# Patient Record
Sex: Female | Born: 1980 | Race: White | Hispanic: No | Marital: Married | Smoking: Never smoker
Health system: Southern US, Community
[De-identification: ages and names within clinical notes are randomized; demographics above are authoritative.]

## PROBLEM LIST (undated history)

## (undated) HISTORY — PX: TUBAL LIGATION: SHX77

---

## 2015-04-20 ENCOUNTER — Ambulatory Visit: Payer: Self-pay | Admitting: Family Medicine

## 2015-04-26 ENCOUNTER — Ambulatory Visit: Payer: Self-pay | Admitting: Family Medicine

## 2015-06-08 ENCOUNTER — Ambulatory Visit (INDEPENDENT_AMBULATORY_CARE_PROVIDER_SITE_OTHER): Payer: BLUE CROSS/BLUE SHIELD

## 2015-06-08 ENCOUNTER — Encounter: Payer: Self-pay | Admitting: Family Medicine

## 2015-06-08 ENCOUNTER — Ambulatory Visit (INDEPENDENT_AMBULATORY_CARE_PROVIDER_SITE_OTHER): Payer: BLUE CROSS/BLUE SHIELD | Admitting: Family Medicine

## 2015-06-08 VITALS — BP 126/69 | HR 104 | Temp 98.3°F | Ht 65.0 in | Wt 204.0 lb

## 2015-06-08 DIAGNOSIS — Z9851 Tubal ligation status: Secondary | ICD-10-CM | POA: Diagnosis not present

## 2015-06-08 DIAGNOSIS — R05 Cough: Secondary | ICD-10-CM

## 2015-06-08 DIAGNOSIS — Z111 Encounter for screening for respiratory tuberculosis: Secondary | ICD-10-CM

## 2015-06-08 DIAGNOSIS — R059 Cough, unspecified: Secondary | ICD-10-CM | POA: Insufficient documentation

## 2015-06-08 MED ORDER — OMEPRAZOLE 40 MG PO CPDR: 40.0000 mg | DELAYED_RELEASE_CAPSULE | Freq: Every day | ORAL | Status: DC

## 2015-06-08 MED ORDER — GUAIFENESIN-CODEINE 100-10 MG/5ML PO SOLN: 5.0000 mL | Freq: Every evening | ORAL | Status: DC | PRN

## 2015-06-08 NOTE — Progress Notes (Signed)
Megan Coffey is a 34 y.o. female who [facility]  today for cough. Patient has a four-week history of bothersome cough. The cough is nonproductive. She denies any wheezing fevers chills nausea vomiting or diarrhea. She does note some mild her burning symptoms. She notes some sneezing and nasal congestion and a postnasal drip feeling. She has tried a Z-Pak and LawyerTessalon Perles which have helped only a little. She notes her symptoms started with a runny nose and sore throat. The cough is persistent. She feels well otherwise. She's tried some of her husband's codeine cough syrup which helps a lot.  Mother has history of latent tuberculosis. Patient has never been tested.   History reviewed. No pertinent past medical history. Past Surgical History  Procedure Laterality Date  . Cesarean section    . Tubal ligation     Social History  Substance Use Topics  . Smoking status: Never Smoker   . Smokeless tobacco: Not on file  . Alcohol Use: 0.0 oz/week    0 Standard drinks or equivalent per week   family history includes Diabetes in her maternal grandfather.  ROS as above Medications: Current Outpatient Prescriptions  Medication Sig Dispense Refill  . benzonatate (TESSALON) 200 MG capsule TAKE 1 CAPSULE 3 TIMES DAILY AS NEEDED FOR COUGH  1  . guaiFENesin-codeine 100-10 MG/5ML syrup Take 5 mLs by mouth at bedtime as needed for cough. 236 mL 0  . omeprazole (PRILOSEC) 40 MG capsule Take 1 capsule (40 mg total) by mouth daily. 30 capsule 3   No current facility-administered medications for this visit.   Allergies  Allergen Reactions  . Hydrocodone-Chlorpheniramine Hives, Itching and Nausea And Vomiting     Exam:  BP 126/69 mmHg  Pulse 104  Temp(Src) 98.3 F (36.8 C) (Oral)  Ht 5\' 5"  (1.651 m)  Wt 204 lb (92.534 kg)  BMI 33.95 kg/m2  SpO2 100% Gen: Well NAD HEENT: EOMI,  MMM clear nasal discharge. Normal tympanic membranes. Posterior  pharynx cobblestoning. No significant cervical lymphadenopathy. Lungs: Normal work of breathing. CTABL Heart: RRR no MRG Abd: NABS, Soft. Nondistended, Nontender Exts: Brisk capillary refill, warm and well perfused.   No results found for this or any previous visit (from the past 24 hour(s)). Dg Chest 2 View  06/08/2015  CLINICAL DATA:  Cough for 2 weeks EXAM: CHEST  2 VIEW COMPARISON:  None. FINDINGS: Cardiomediastinal silhouette is unremarkable. No acute infiltrate or pleural effusion. No pulmonary edema. Bony thorax is unremarkable. IMPRESSION: No active cardiopulmonary disease. Electronically Signed   By: Natasha MeadLiviu  Pop M.D.   On: 06/08/2015 15:35     Please see individual assessment and plan sections.

## 2015-06-08 NOTE — Assessment & Plan Note (Signed)
Subacute cough. Treat with omeprazole codeine cough syrup nasal steroid spray and Zyrtec. Check PPD his mother has history of latent tuberculosis. Return in 2-4 weeks

## 2015-06-08 NOTE — Patient Instructions (Signed)
Thank you for coming in today. Get xray today.  Surgeon taking omeprazole daily. Also take over-the-counter Zyrtec daily and use Flonase, Rhinocort, or Nasonex. Use codeine cough syrup at bedtime as needed. Do not drive after taking this medicine. Return On Friday to read the PPD test Return in 2-4 weeks if no better. Call or go to the emergency room if you get worse, have trouble breathing, have chest pains, or palpitations.    Cough, Adult Coughing is a reflex that clears your throat and your airways. Coughing helps to heal and protect your lungs. It is normal to cough occasionally, but a cough that happens with other symptoms or lasts a long time may be a sign of a condition that needs treatment. A cough may last only 2-3 weeks (acute), or it may last longer than 8 weeks (chronic). CAUSES Coughing is commonly caused by:  Breathing in substances that irritate your lungs.  A viral or bacterial respiratory infection.  Allergies.  Asthma.  Postnasal drip.  Smoking.  Acid backing up from the stomach into the esophagus (gastroesophageal reflux).  Certain medicines.  Chronic lung problems, including COPD (or rarely, lung cancer).  Other medical conditions such as heart failure. HOME CARE INSTRUCTIONS  Pay attention to any changes in your symptoms. Take these actions to help with your discomfort:  Take medicines only as told by your health care provider.  If you were prescribed an antibiotic medicine, take it as told by your health care provider. Do not stop taking the antibiotic even if you start to feel better.  Talk with your health care provider before you take a cough suppressant medicine.  Drink enough fluid to keep your urine clear or pale yellow.  If the air is dry, use a cold steam vaporizer or humidifier in your bedroom or your home to help loosen secretions.  Avoid anything that causes you to cough at work or at home.  If your cough is worse at night, try  sleeping in a semi-upright position.  Avoid cigarette smoke. If you smoke, quit smoking. If you need help quitting, ask your health care provider.  Avoid caffeine.  Avoid alcohol.  Rest as needed. SEEK MEDICAL CARE IF:   You have new symptoms.  You cough up pus.  Your cough does not get better after 2-3 weeks, or your cough gets worse.  You cannot control your cough with suppressant medicines and you are losing sleep.  You develop pain that is getting worse or pain that is not controlled with pain medicines.  You have a fever.  You have unexplained weight loss.  You have night sweats. SEEK IMMEDIATE MEDICAL CARE IF:  You cough up blood.  You have difficulty breathing.  Your heartbeat is very fast.   This information is not intended to replace advice given to you by your health care provider. Make sure you discuss any questions you have with your health care provider.   Document Released: 02/09/2011 Document Revised: 05/04/2015 Document Reviewed: 10/20/2014 Elsevier Interactive Patient Education [employer] Inc[employer]

## 2015-06-08 NOTE — Progress Notes (Signed)
Quick Note:  Xray normal. ______ 

## 2015-06-10 ENCOUNTER — Encounter: Payer: Self-pay | Admitting: *Deleted

## 2015-12-14 ENCOUNTER — Ambulatory Visit (INDEPENDENT_AMBULATORY_CARE_PROVIDER_SITE_OTHER): Payer: BLUE CROSS/BLUE SHIELD | Admitting: Family Medicine

## 2015-12-14 ENCOUNTER — Encounter: Payer: Self-pay | Admitting: Family Medicine

## 2015-12-14 ENCOUNTER — Ambulatory Visit (HOSPITAL_BASED_OUTPATIENT_CLINIC_OR_DEPARTMENT_OTHER)
Admission: RE | Admit: 2015-12-14 | Discharge: 2015-12-14 | Disposition: A | Discharge status: Home / Self Care | Payer: BLUE CROSS/BLUE SHIELD | Source: Ambulatory Visit | Attending: Family Medicine | Admitting: Family Medicine

## 2015-12-14 VITALS — BP 125/81 | HR 97 | Wt 198.0 lb

## 2015-12-14 DIAGNOSIS — R1011 Right upper quadrant pain: Secondary | ICD-10-CM | POA: Insufficient documentation

## 2015-12-14 NOTE — Patient Instructions (Signed)
Thank you for coming in today. We can get you in [facility] at [facility].  Get labs now then get Ultrasound at 330.  We will call tonight or tomorrow with results.  Eat a clear liquid diet.   If your belly pain worsens, or you have high fever, bad vomiting, blood in your stool or black tarry stool go to the Emergency Room.   Cholecystitis Cholecystitis is inflammation of the gallbladder. It is often called a gallbladder attack. The gallbladder is a pear-shaped organ that lies beneath the liver on the right side of the body. The gallbladder stores bile, which is a fluid that helps the body to digest fats. If bile builds up in your gallbladder, your gallbladder becomes inflamed. This condition may occur suddenly (be acute). Repeat episodes of acute cholecystitis or prolonged episodes may lead to a long-term (chronic) condition. Cholecystitis is serious and it requires treatment.  CAUSES The most common cause of this condition is gallstones. Gallstones can block the tube (duct) that carries bile out of your gallbladder. This causes bile to build up. Other causes of this condition include:  Damage to the gallbladder due to a decrease in blood flow.  Infections in the bile ducts.  Scars or kinks in the bile ducts.  Tumors in the liver, pancreas, or gallbladder. RISK FACTORS This condition is more likely to develop in:  People who have sickle cell disease.  People who take birth control pills or use estrogen.  People who have alcoholic liver disease.  People who have liver cirrhosis.  People who have their nutrition delivered through a vein (parenteral nutrition).  People who do not eat or drink (do fasting) for a long period of time.  People who are obese.  People who have rapid weight loss.  People who are pregnant.  People who have increased triglyceride levels.  People who have pancreatitis. SYMPTOMS Symptoms of this condition include:  Abdominal pain, especially in  the upper right area of the abdomen.  Abdominal tenderness or bloating.  Nausea.  Vomiting.  Fever.  Chills.  Yellowing of the skin and the whites of the eyes (jaundice). DIAGNOSIS This condition is diagnosed with a medical history and physical exam. You may also have other tests, including:  Imaging tests, such as:  An ultrasound of the gallbladder.  A CT scan of the abdomen.  A gallbladder nuclear scan (HIDA scan). This scan allows your health care provider to see the bile moving from your liver to your gallbladder and to your small intestine.  MRI.  Blood tests, such as:  A complete blood count, because the white blood cell count may be higher than normal.  Liver function tests, because some levels may be higher than normal with certain types of gallstones. TREATMENT Treatment may include:  Fasting for a certain amount of time.  IV fluids.  Medicine to treat pain or vomiting.  Antibiotic medicine.  Surgery to remove your gallbladder (cholecystectomy). This may happen immediately or at a later time. HOME CARE INSTRUCTIONS Home care will depend on your treatment. In general:  Take over-the-counter and prescription medicines only as told by your health care provider.  If you were prescribed an antibiotic medicine, take it as told by your health care provider. Do not stop taking the antibiotic even if you start to feel better.  Follow instructions from your health care provider about what to eat or drink. When you are allowed to eat, avoid eating or drinking anything that triggers your symptoms.  Keep all follow-up visits as told by your health care provider. This is important. SEEK MEDICAL CARE IF:  Your pain is not controlled with medicine.  You have a fever. SEEK IMMEDIATE MEDICAL CARE IF:  Your pain moves to another part of your abdomen or to your back.  You continue to have symptoms or you develop new symptoms even with treatment.   This information  is not intended to replace advice given to you by your health care provider. Make sure you discuss any questions you have with your health care provider.   Document Released: 08/13/2005 Document Revised: 05/04/2015 Document Reviewed: 11/24/2014 Elsevier Interactive Patient Education [employer]

## 2015-12-14 NOTE — Progress Notes (Signed)
       Megan Coffey is a 35 y.o. female who presents to [facility]: [facility] today for abdominal pain. Patient has a one-day history of right upper quadrant abdominal pain. She denies objective fevers but notes chills and body aches. She denies any nausea vomiting or diarrhea but does note decreased appetite. She notes the pain radiates to the back and is intermittent. She was seen by her work physician is concern for gallbladder disease. She denies any history of abdominal surgery aside from C-section and tubal ligation.   No past medical history on file. Past Surgical History  Procedure Laterality Date  . Cesarean section    . Tubal ligation     Social History  Substance Use Topics  . Smoking status: Never Smoker   . Smokeless tobacco: Not on file  . Alcohol Use: 0.0 oz/week    0 Standard drinks or equivalent per week   family history includes Diabetes in her maternal grandfather.  ROS as above Medications: No current outpatient prescriptions on file.   No current facility-administered medications for this visit.   Allergies  Allergen Reactions  . Hydrocodone-Chlorpheniramine Hives, Itching and Nausea And Vomiting     Exam:  BP 125/81 mmHg  Pulse 97  Wt 198 lb (89.812 kg) Gen: Well NAD Nontoxic appearing HEENT: EOMI,  MMM Lungs: Normal work of breathing. CTABL Heart: RRR no MRG Abd: NABS, Soft. Nondistended, tender palpation right upper quadrant with positive Murphy sign. No masses palpated. No rebound or guarding. Exts: Brisk capillary refill, warm and well perfused.   No results found for this or any previous visit (from the past 24 hour(s)). No results found.   [age] year old woman with right upper quadrant abdominal pain. Positive Murphy sign. Concerning for Colecystitis. Plan for CBC CMP lipase and stat abdominal ultrasound. Patient with results tonight or tomorrow.

## 2015-12-15 LAB — CBC
HCT: 39.7 % (ref 35.0–45.0)
Hemoglobin: 12.9 g/dL (ref 11.7–15.5)
MCH: 29.8 pg (ref 27.0–33.0)
MCHC: 32.5 g/dL (ref 32.0–36.0)
MCV: 91.7 fL (ref 80.0–100.0)
MPV: 10.6 fL (ref 7.5–12.5)
PLATELETS: 339 10*3/uL (ref 140–400)
RBC: 4.33 MIL/uL (ref 3.80–5.10)
RDW: 13.1 % (ref 11.0–15.0)
WBC: 10.1 10*3/uL (ref 3.8–10.8)

## 2015-12-15 LAB — COMPREHENSIVE METABOLIC PANEL
ALK PHOS: 56 U/L (ref 33–115)
ALT: 13 U/L (ref 6–29)
AST: 13 U/L (ref 10–30)
Albumin: 3.4 g/dL — ABNORMAL LOW (ref 3.6–5.1)
BILIRUBIN TOTAL: 0.5 mg/dL (ref 0.2–1.2)
BUN: 8 mg/dL (ref 7–25)
CO2: 24 mmol/L (ref 20–31)
CREATININE: 0.75 mg/dL (ref 0.50–1.10)
Calcium: 8.6 mg/dL (ref 8.6–10.2)
Chloride: 103 mmol/L (ref 98–110)
GLUCOSE: 88 mg/dL (ref 65–99)
Potassium: 4.1 mmol/L (ref 3.5–5.3)
Sodium: 138 mmol/L (ref 135–146)
TOTAL PROTEIN: 6.5 g/dL (ref 6.1–8.1)

## 2015-12-15 LAB — LIPASE: LIPASE: 6 U/L — AB (ref 7–60)

## 2015-12-15 NOTE — Progress Notes (Signed)
Quick Note:  Labs were normal. ______ 

## 2015-12-15 NOTE — Progress Notes (Signed)
Quick Note:  Ultrasound was normal appearing ______

## 2015-12-16 ENCOUNTER — Telehealth: Payer: Self-pay | Admitting: Family Medicine

## 2015-12-16 NOTE — Telephone Encounter (Signed)
Pt notified. Advised to contact us on Monday 12/19/2015 is she is still experiencing pain so that we can order a CT. Pt verbalized understanding.

## 2015-12-16 NOTE — Telephone Encounter (Signed)
I received a note stating that because the pancreas was not visible on ultrasound (very common finding) could the pain be coming from the pancreas. I think it could however the lipase was not elevated. We will continue to follow. If not better we will get a CT of the abd and pelvis.

## 2015-12-16 NOTE — Telephone Encounter (Signed)
-----   Message from Collie SiadKelsi M Richardson, LPN sent at 1/61/09604/20/2017 10:17 AM EDT ----- Pt advised of results, states she looked at them on MyChart and questions if its a problem the pancreas wasn't able to be viewed. Wonders if PCP thinks this could be the cause of her problem. Will route to PCP for review.

## 2015-12-28 ENCOUNTER — Telehealth: Payer: Self-pay

## 2015-12-28 NOTE — Telephone Encounter (Signed)
Called pt to discuss questions and/or concerns she may have after being informed that she had been trying to get in touch with me. Pt advised that she has decided to go with a different provider. Apologized for loosing her as a pt and wished her good luck.

## 2016-09-05 ENCOUNTER — Ambulatory Visit
Admission: RE | Admit: 2016-09-05 | Discharge: 2016-09-05 | Disposition: A | Discharge status: Home / Self Care | Payer: BLUE CROSS/BLUE SHIELD | Source: Ambulatory Visit | Attending: Family Medicine | Admitting: Family Medicine

## 2016-09-05 ENCOUNTER — Other Ambulatory Visit (HOSPITAL_COMMUNITY): Payer: Self-pay | Admitting: Family Medicine

## 2016-09-05 DIAGNOSIS — W19XXXA Unspecified fall, initial encounter: Secondary | ICD-10-CM

## 2017-06-08 IMAGING — US US ABDOMEN COMPLETE
1 series · 14 of 25 positions shown · non-contrast
Comparison: None.

CLINICAL DATA: Upper abdominal pain for 1 day

EXAM:
ABDOMEN ULTRASOUND COMPLETE

[Series 1: us abdomen complete · 0.17mm/px · 14 of 82 slices shown]
[im 1/82]
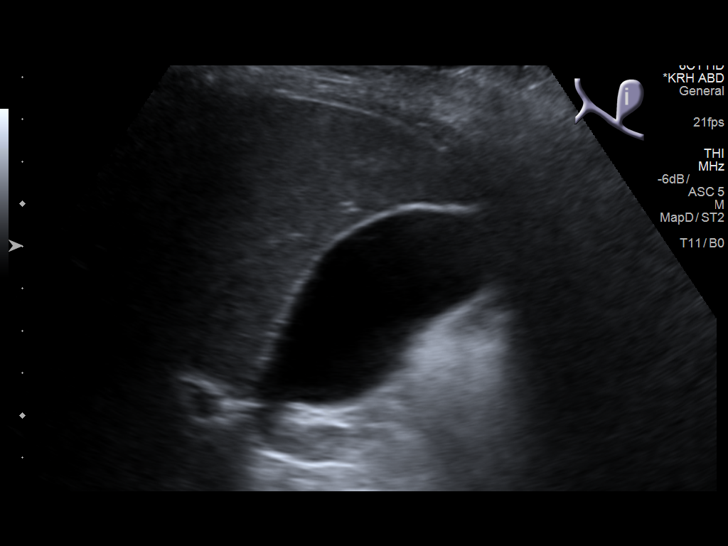
[im 7/82]
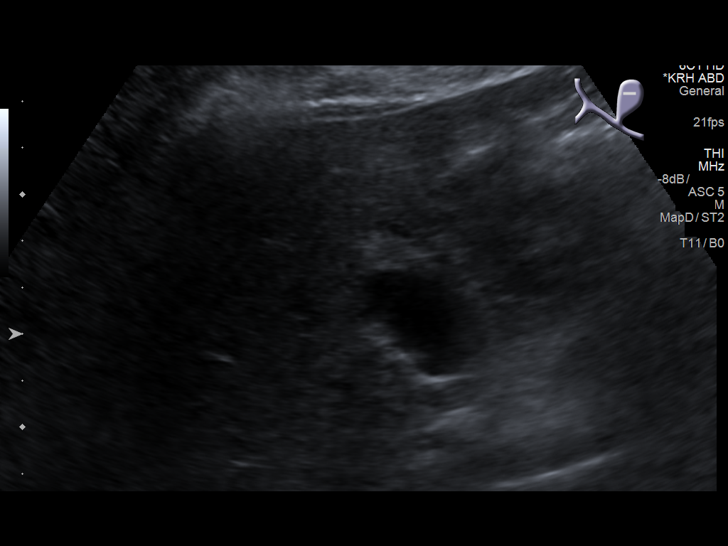
[im 14/82]
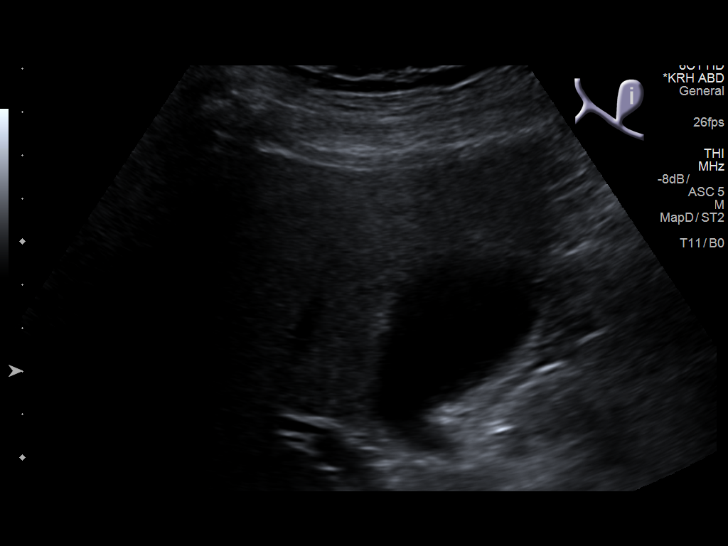
[im 21/82]
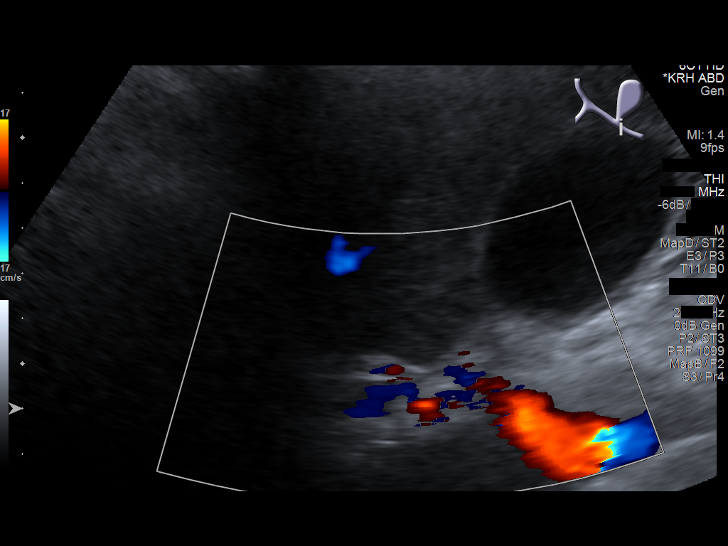
[im 28/82]
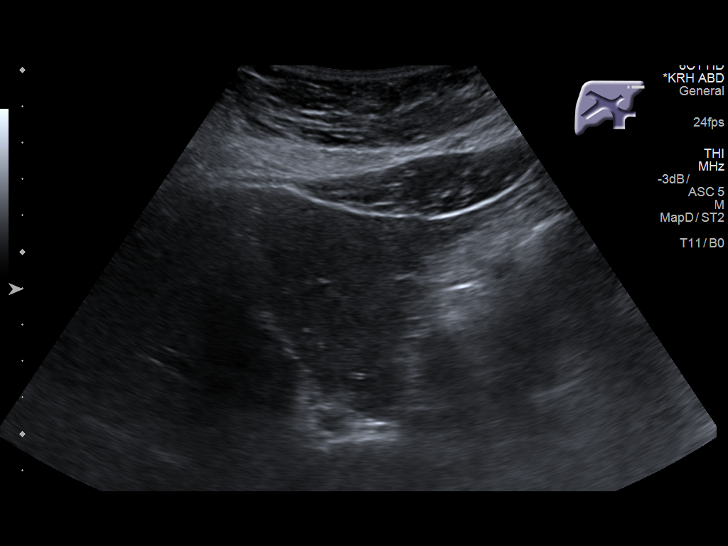
[im 31/82]
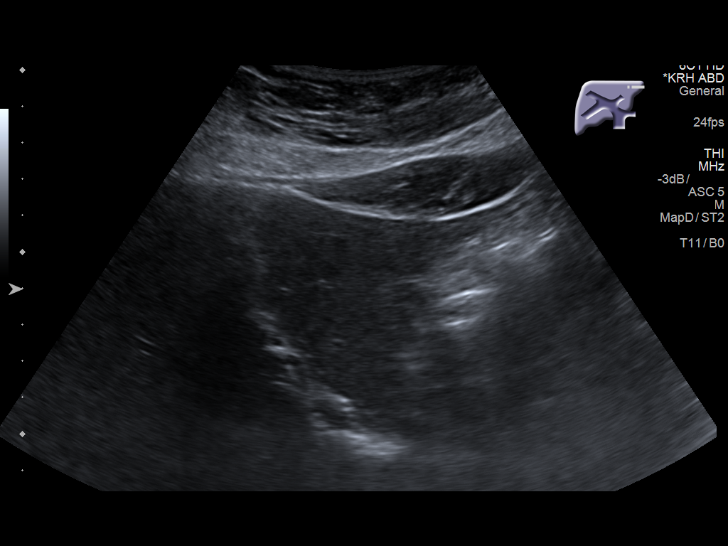
[im 38/82]
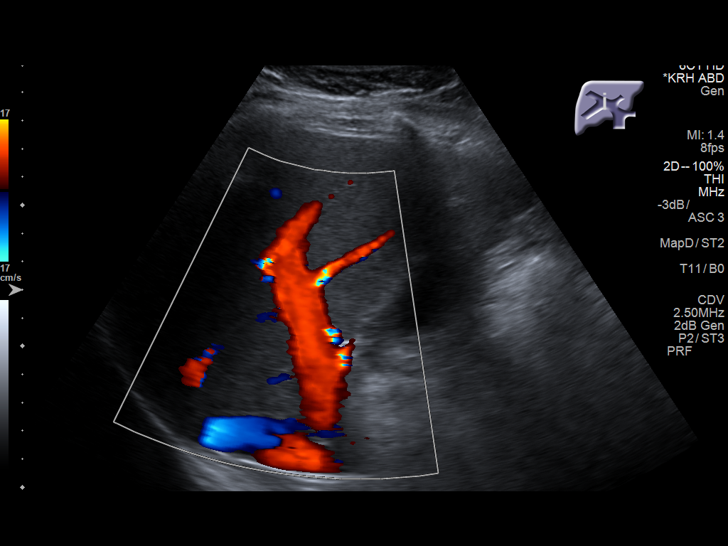
[im 44/82]
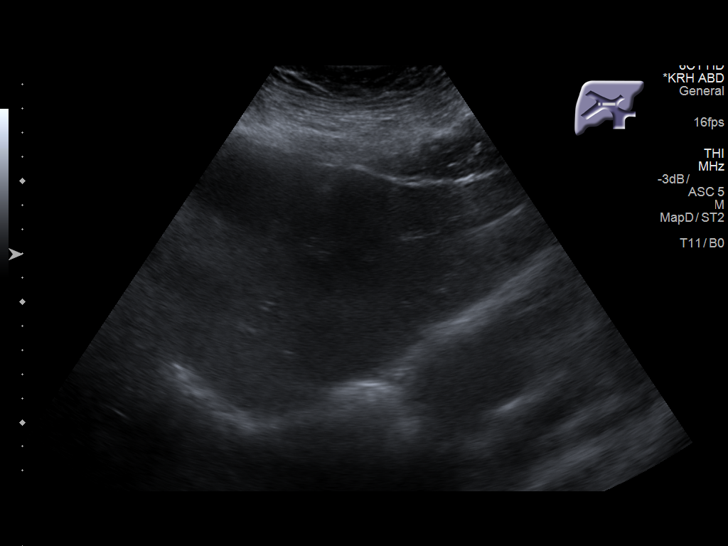
[im 51/82]
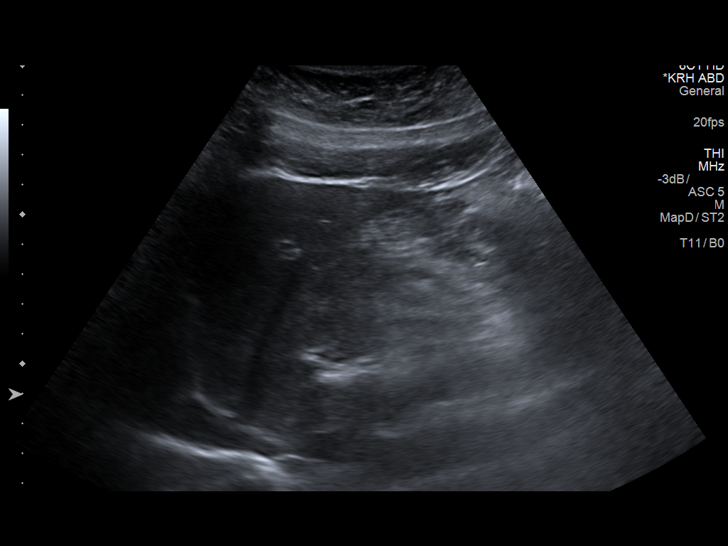
[im 55/82]
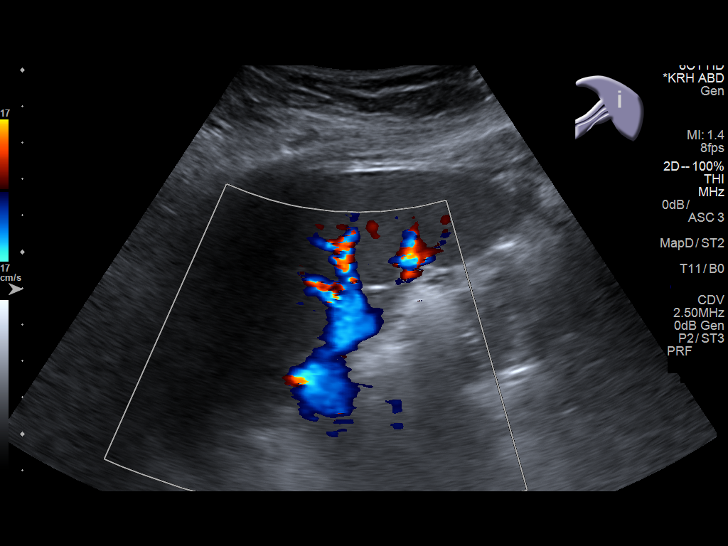
[im 61/82]
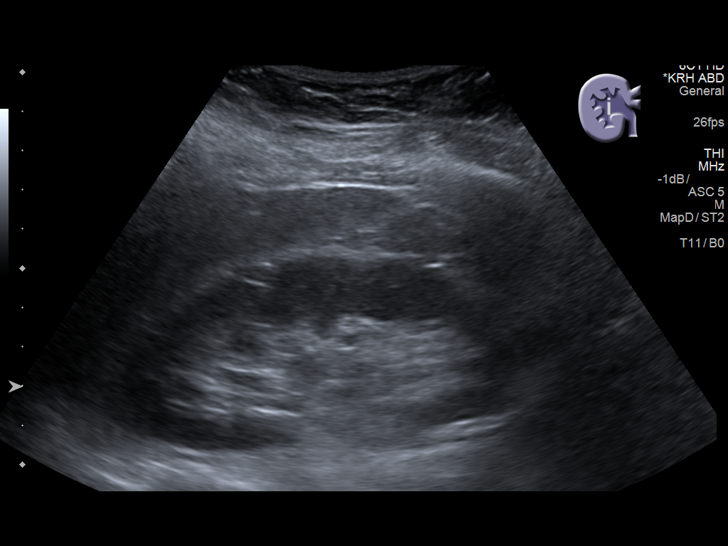
[im 68/82]
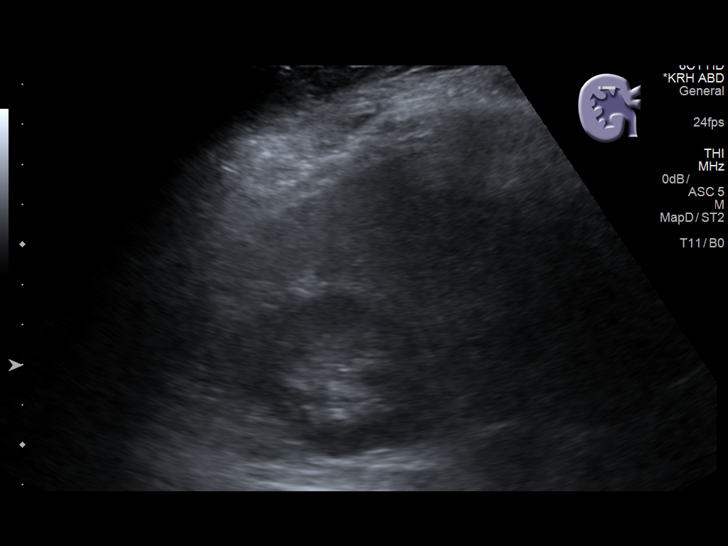
[im 75/82]
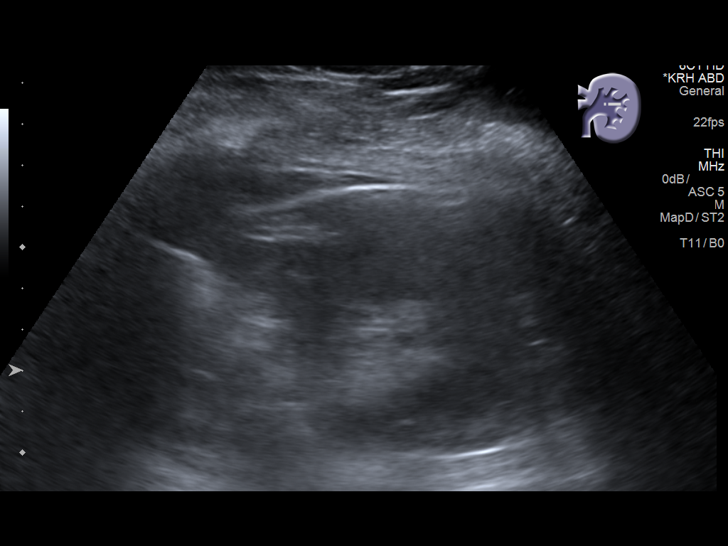
[im 82/82]
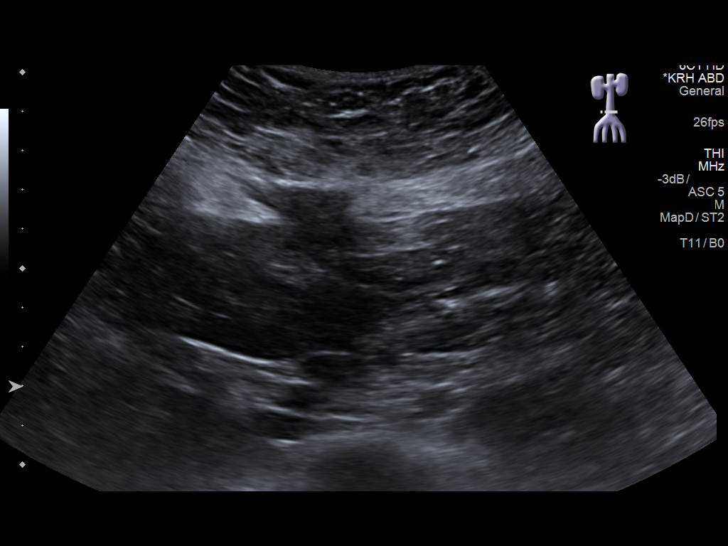

[14 of 25 positions shown; findings below may reference images not displayed]

FINDINGS: Gallbladder: No gallstones or wall thickening visualized. There is
no pericholecystic fluid. No sonographic Murphy sign noted by
sonographer.

Common bile duct: Diameter: 2 mm. There is no intrahepatic, common
hepatic, common bile duct dilatation.

Liver: No focal lesion identified. Within normal limits in
parenchymal echogenicity.

IVC: No abnormality visualized in visualized regions. Portions of
inferior vena cava obscured by gas.

Pancreas: Essentially completely obscured by gas.

Spleen: Size and appearance within normal limits.

Right Kidney: Length: 10.2 cm. Echogenicity within normal limits. No
mass or hydronephrosis visualized.

Left Kidney: Length: 11.9 cm. Echogenicity within normal limits. No
mass or hydronephrosis visualized.

Abdominal aorta: No aneurysm visualized.

Other findings: No demonstrable ascites.
IMPRESSION: Pancreas is essentially completely obscured by gas. Portions of the
inferior vena cava are obscured by gas. Study otherwise
unremarkable.

## 2018-09-29 IMAGING — CR DG WRIST COMPLETE 3+V*L*
4 series · 4 of 4 positions shown · non-contrast
Comparison: None.

CLINICAL DATA: Fall.  Left wrist pain.

EXAM:
LEFT WRIST - COMPLETE 3+ VIEW

[x wrist pa left]
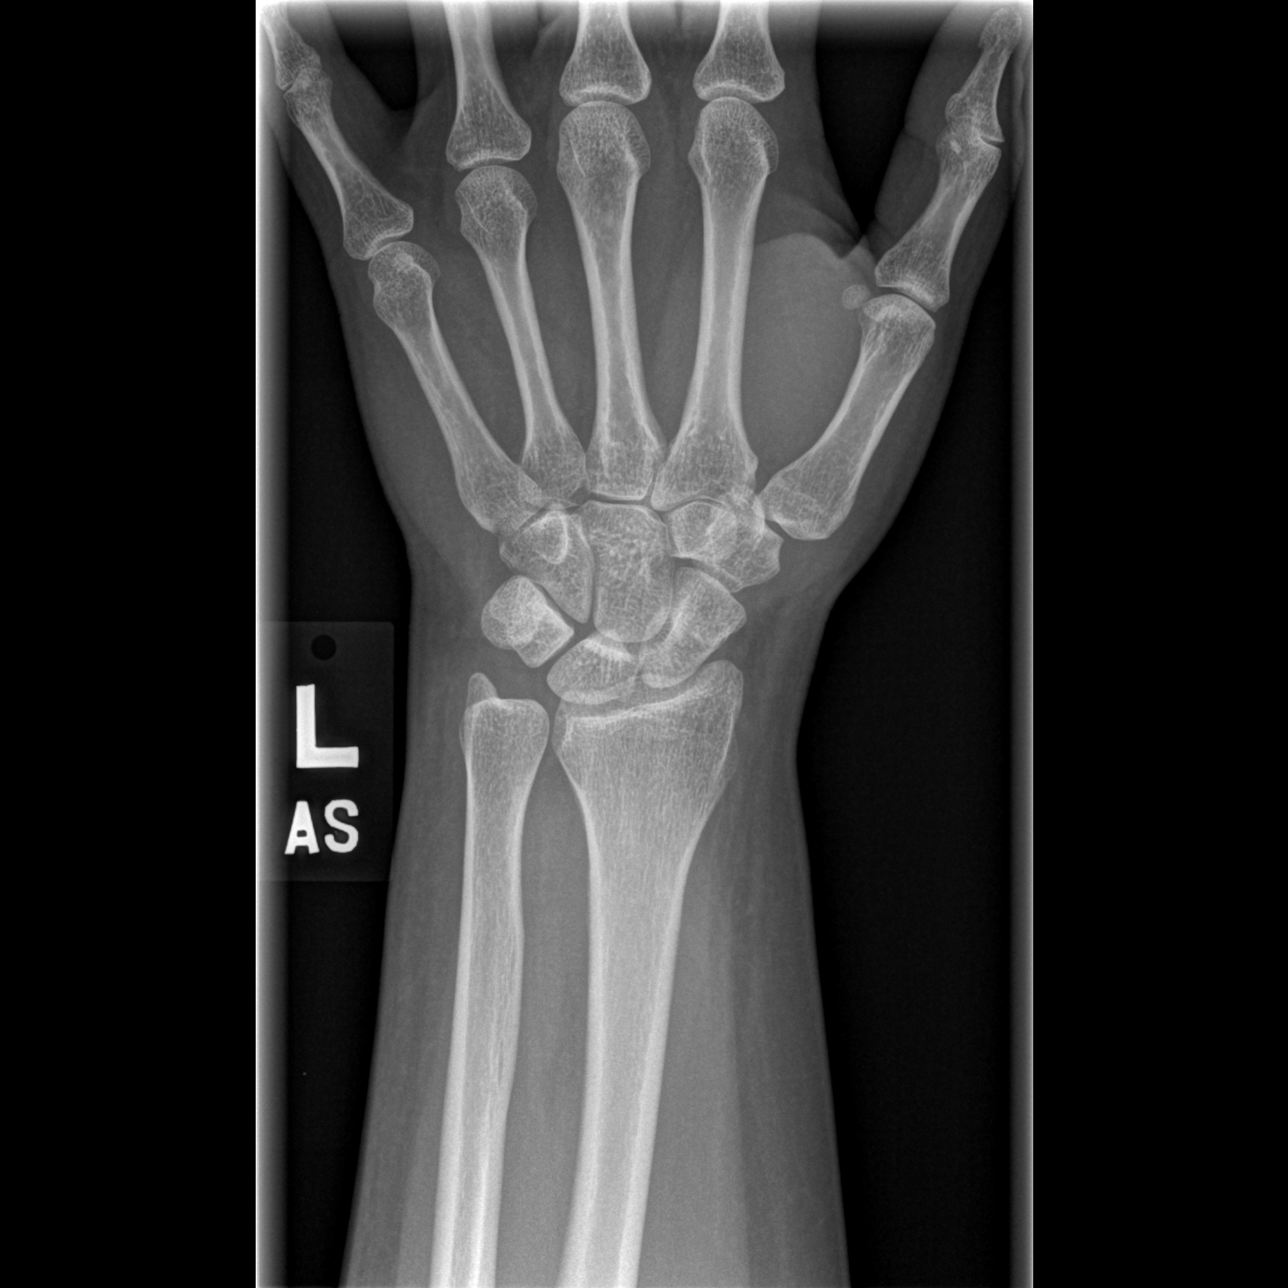

[x wrist obl left]
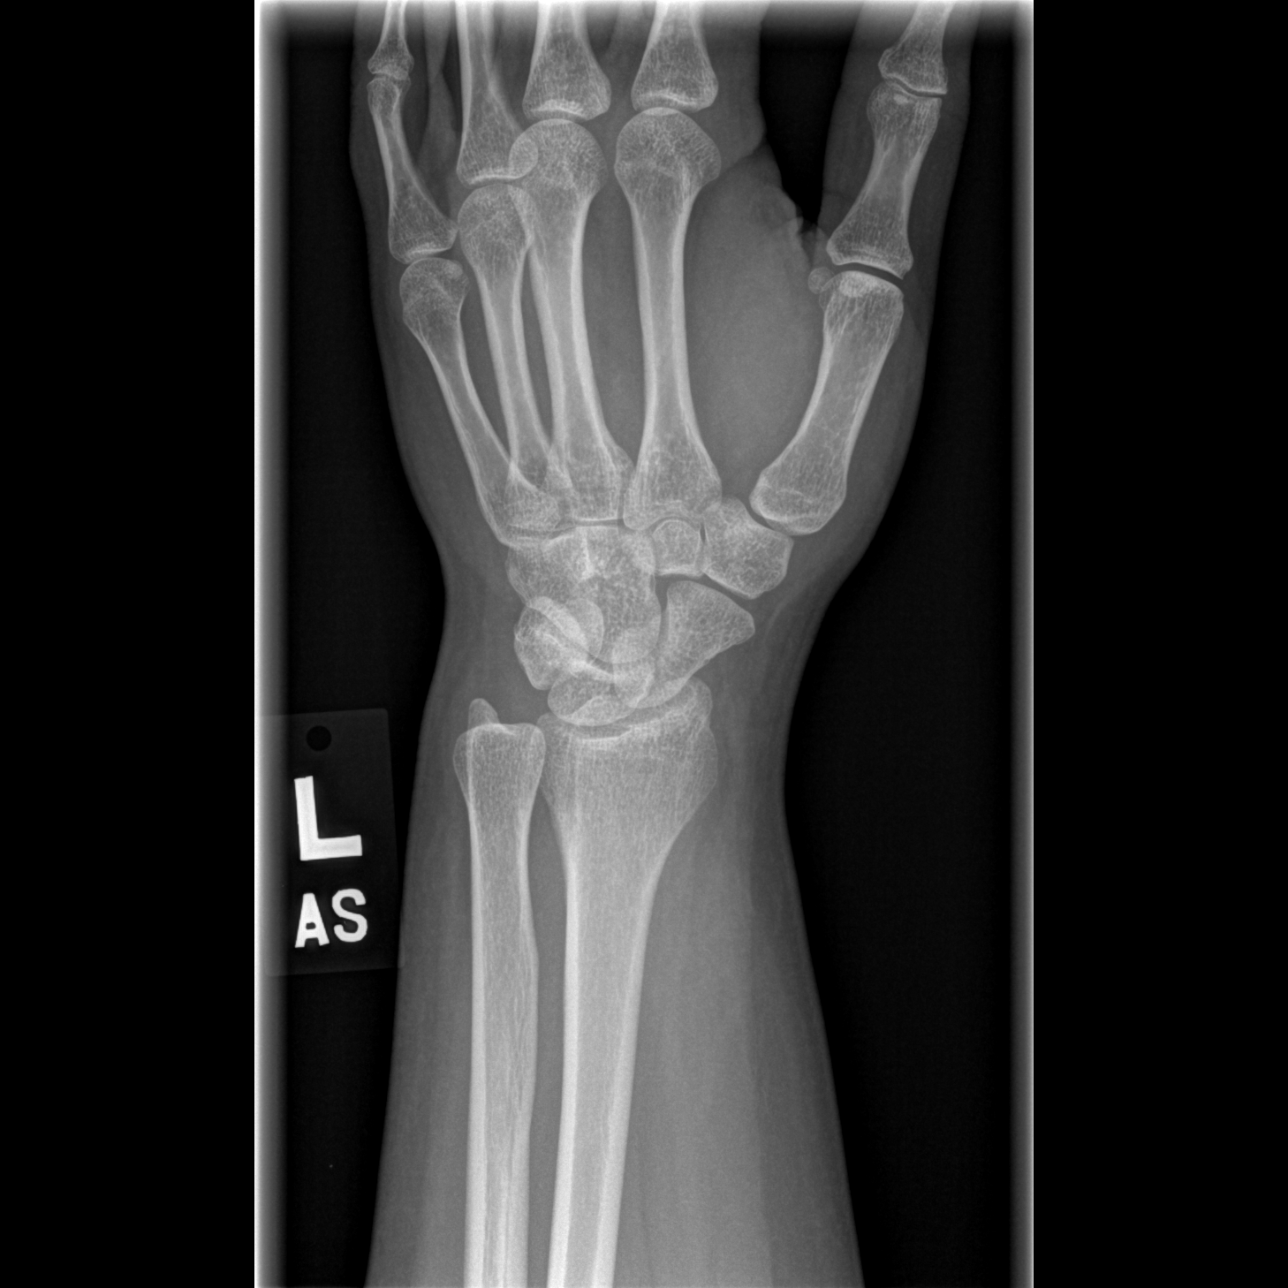

[x wrist lat left]
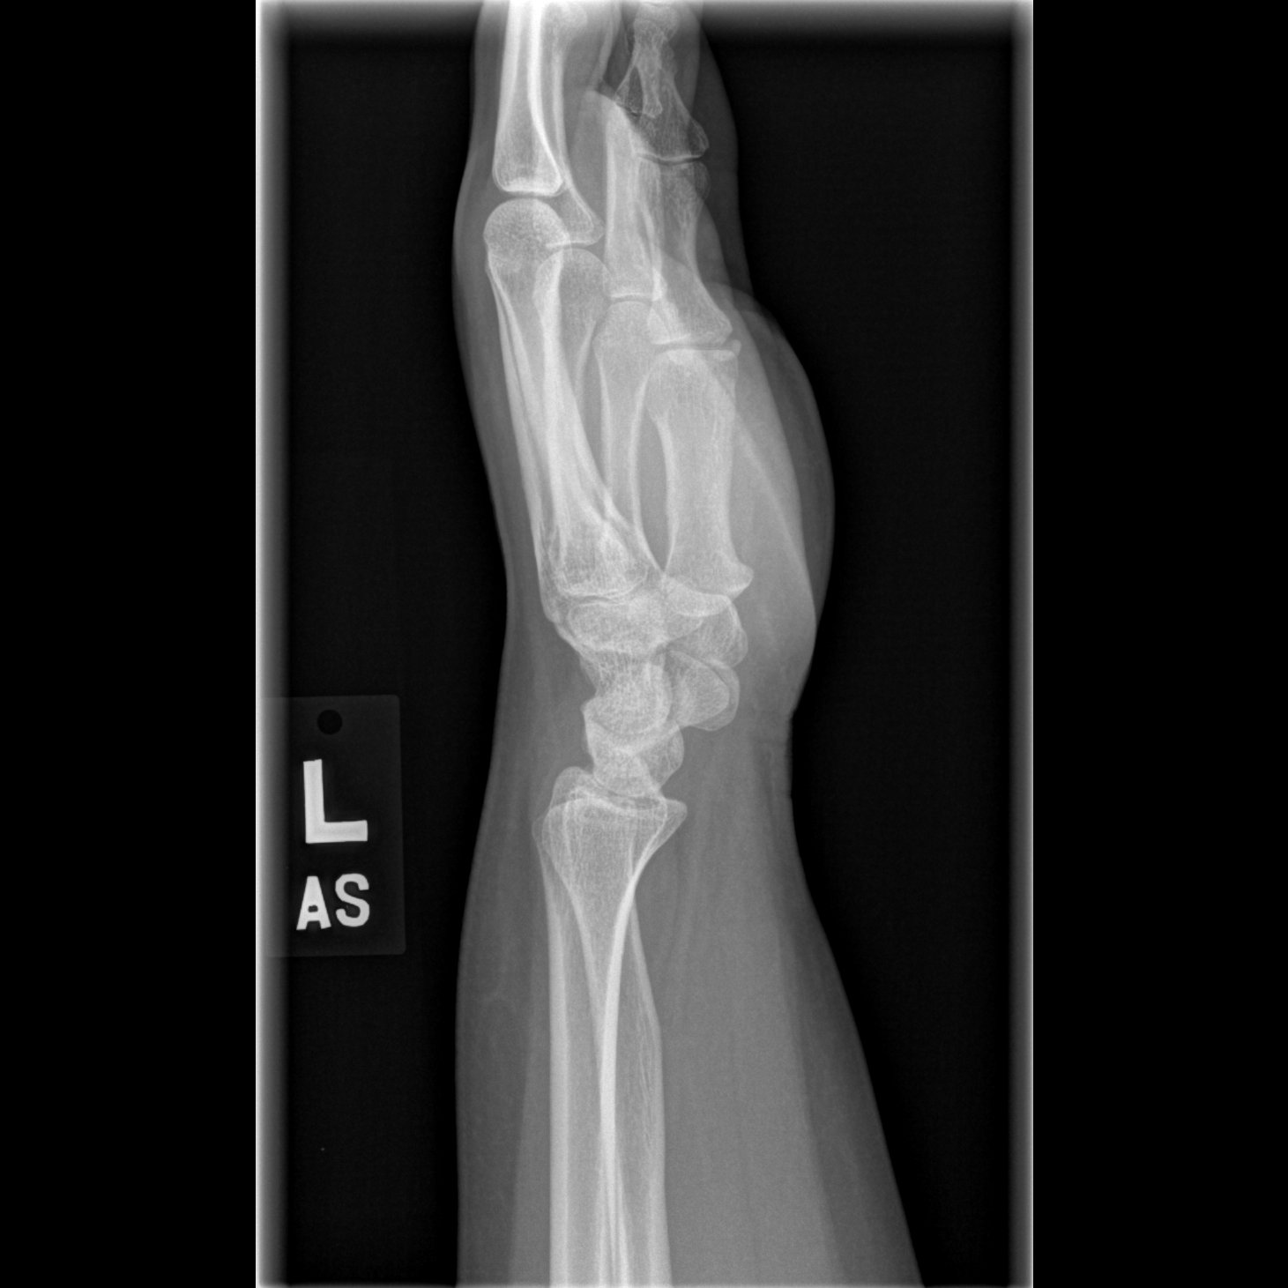

[x navicular]
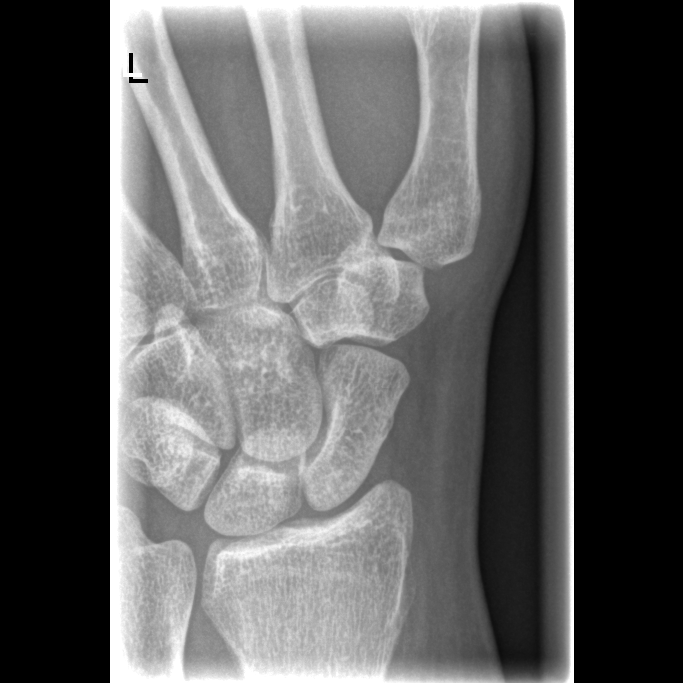

[4 of 4 positions shown; findings below may reference images not displayed]

FINDINGS: No acute bony abnormality. Specifically, no fracture, subluxation,
or dislocation. Soft tissues are intact.
IMPRESSION: No acute bony abnormality.

## 2024-01-07 ENCOUNTER — Other Ambulatory Visit: Payer: Self-pay | Admitting: Family Medicine

## 2024-01-07 DIAGNOSIS — M542 Cervicalgia: Secondary | ICD-10-CM

## 2024-01-07 NOTE — Progress Notes (Signed)
 6 wks of neck pain. Getting worse. Constant. Crunching sound w/ rotational movement. No injury. NSAIDs w/ temporary relief.

## 2024-01-10 ENCOUNTER — Ambulatory Visit
Admission: RE | Admit: 2024-01-10 | Discharge: 2024-01-10 | Disposition: A | Discharge status: Home / Self Care | Source: Ambulatory Visit | Attending: Family Medicine | Admitting: Family Medicine

## 2024-01-10 DIAGNOSIS — M542 Cervicalgia: Secondary | ICD-10-CM
